# Patient Record
Sex: Male | Born: 1960 | Race: White | Hispanic: No | Marital: Married | Smoking: Never smoker
Health system: Southern US, Community
[De-identification: ages and names within clinical notes are randomized; demographics above are authoritative.]

## PROBLEM LIST (undated history)

## (undated) DIAGNOSIS — K9041 Non-celiac gluten sensitivity: Secondary | ICD-10-CM

## (undated) DIAGNOSIS — E785 Hyperlipidemia, unspecified: Secondary | ICD-10-CM

## (undated) DIAGNOSIS — F32A Depression, unspecified: Secondary | ICD-10-CM

## (undated) DIAGNOSIS — K635 Polyp of colon: Secondary | ICD-10-CM

## (undated) DIAGNOSIS — F329 Major depressive disorder, single episode, unspecified: Secondary | ICD-10-CM

## (undated) DIAGNOSIS — E8881 Metabolic syndrome: Secondary | ICD-10-CM

## (undated) DIAGNOSIS — I10 Essential (primary) hypertension: Secondary | ICD-10-CM

## (undated) DIAGNOSIS — R1031 Right lower quadrant pain: Secondary | ICD-10-CM

## (undated) DIAGNOSIS — S8992XA Unspecified injury of left lower leg, initial encounter: Secondary | ICD-10-CM

## (undated) DIAGNOSIS — B351 Tinea unguium: Secondary | ICD-10-CM

## (undated) DIAGNOSIS — E291 Testicular hypofunction: Secondary | ICD-10-CM

## (undated) DIAGNOSIS — M7121 Synovial cyst of popliteal space [Baker], right knee: Secondary | ICD-10-CM

## (undated) DIAGNOSIS — G43909 Migraine, unspecified, not intractable, without status migrainosus: Secondary | ICD-10-CM

## (undated) DIAGNOSIS — M542 Cervicalgia: Secondary | ICD-10-CM

## (undated) DIAGNOSIS — M1990 Unspecified osteoarthritis, unspecified site: Secondary | ICD-10-CM

## (undated) DIAGNOSIS — F419 Anxiety disorder, unspecified: Secondary | ICD-10-CM

## (undated) HISTORY — DX: Tinea unguium: B35.1

## (undated) HISTORY — DX: Non-celiac gluten sensitivity: K90.41

## (undated) HISTORY — DX: Unspecified osteoarthritis, unspecified site: M19.90

## (undated) HISTORY — DX: Anxiety disorder, unspecified: F41.9

## (undated) HISTORY — DX: Right lower quadrant pain: R10.31

## (undated) HISTORY — DX: Essential (primary) hypertension: I10

## (undated) HISTORY — DX: Synovial cyst of popliteal space (Baker), right knee: M71.21

## (undated) HISTORY — DX: Cervicalgia: M54.2

## (undated) HISTORY — DX: Metabolic syndrome: E88.81

## (undated) HISTORY — DX: Migraine, unspecified, not intractable, without status migrainosus: G43.909

## (undated) HISTORY — PX: OTHER SURGICAL HISTORY: SHX169

## (undated) HISTORY — DX: Testicular hypofunction: E29.1

## (undated) HISTORY — DX: Depression, unspecified: F32.A

## (undated) HISTORY — DX: Unspecified injury of left lower leg, initial encounter: S89.92XA

## (undated) HISTORY — DX: Polyp of colon: K63.5

## (undated) HISTORY — DX: Hyperlipidemia, unspecified: E78.5

## (undated) HISTORY — DX: Major depressive disorder, single episode, unspecified: F32.9

---

## 2000-07-28 ENCOUNTER — Ambulatory Visit (HOSPITAL_COMMUNITY): Admission: RE | Admit: 2000-07-28 | Discharge: 2000-07-28 | Payer: Self-pay | Admitting: Internal Medicine

## 2005-12-24 ENCOUNTER — Encounter: Admission: RE | Admit: 2005-12-24 | Discharge: 2005-12-24 | Payer: Self-pay | Admitting: Internal Medicine

## 2007-10-03 IMAGING — CR DG CERVICAL SPINE WITH FLEX & EXTEND
7 series · 7 of 7 positions shown · non-contrast
Comparison: none

CLINICAL DATA: Neck pain.  Degenerative disease.
CERVICAL SPINE, SEVEN VIEWS:

[w c-spine lat]
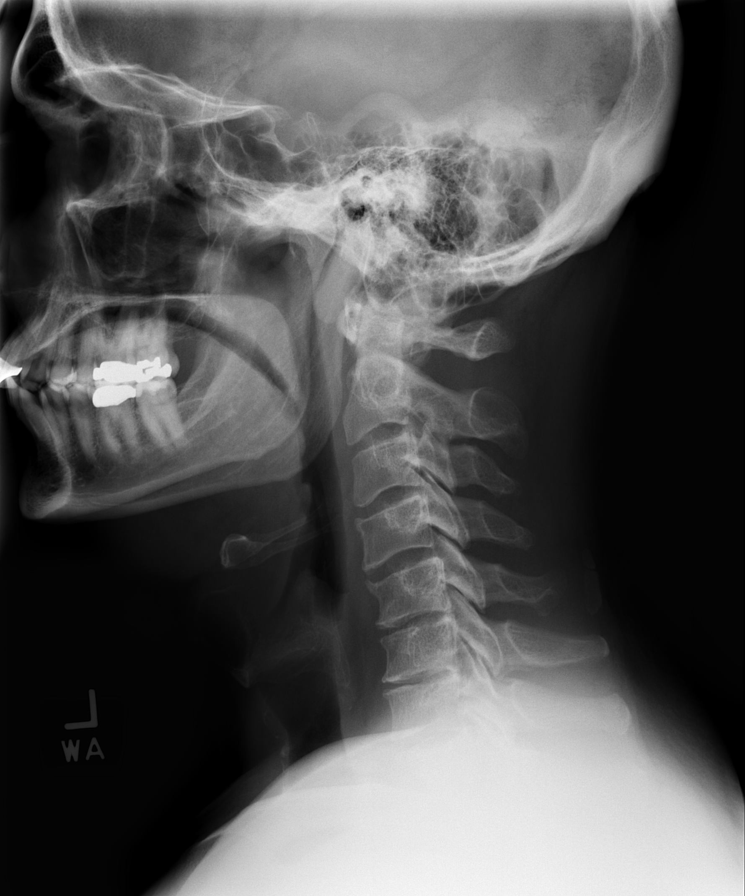

[w c-spine flexion]
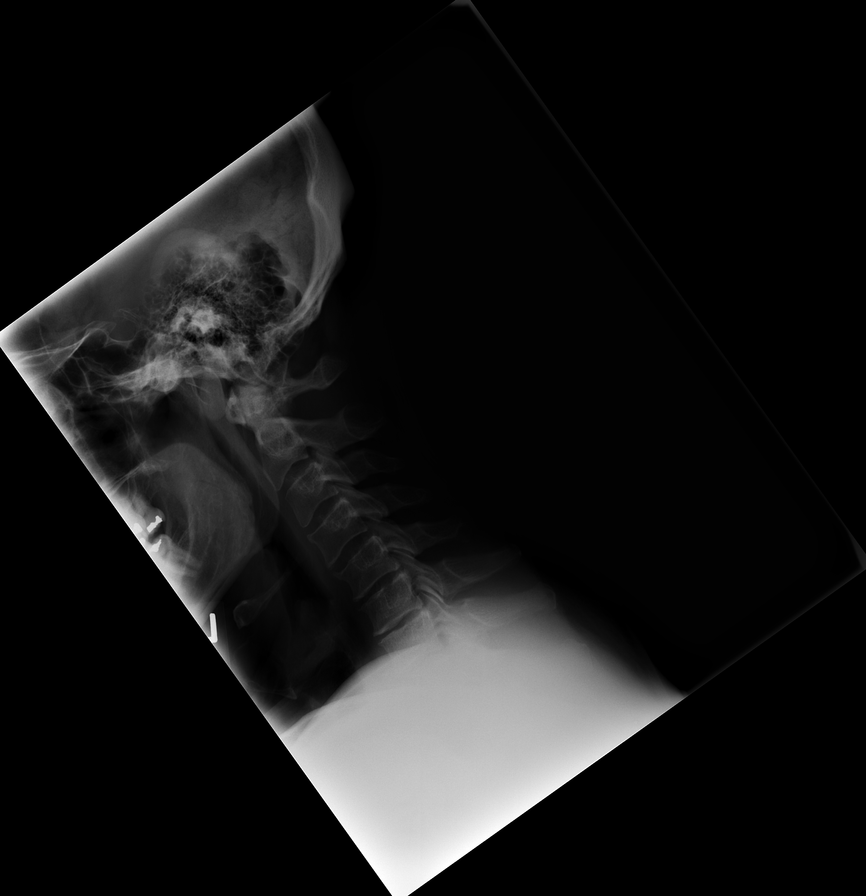

[w c-spine extension]
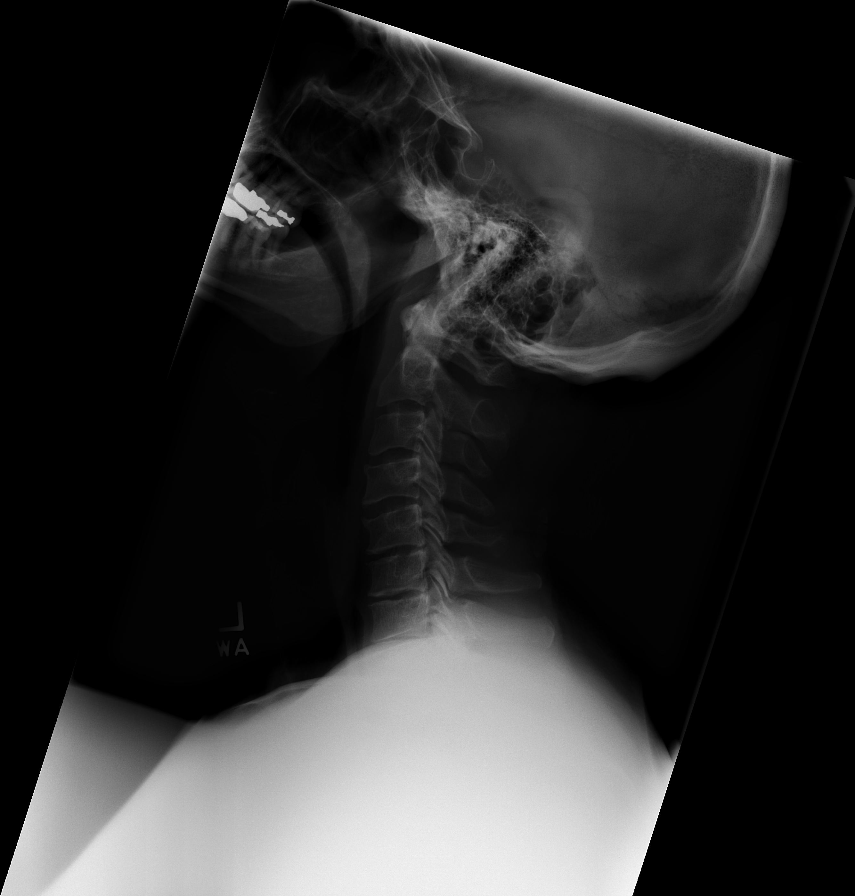

[w c-spine oblique (1 of 2)]
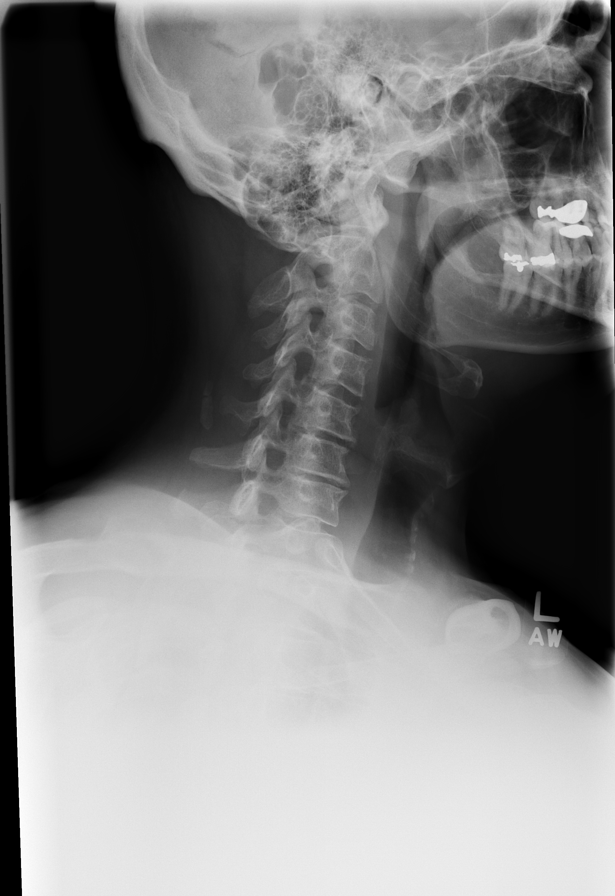

[w c-spine oblique (2 of 2)]
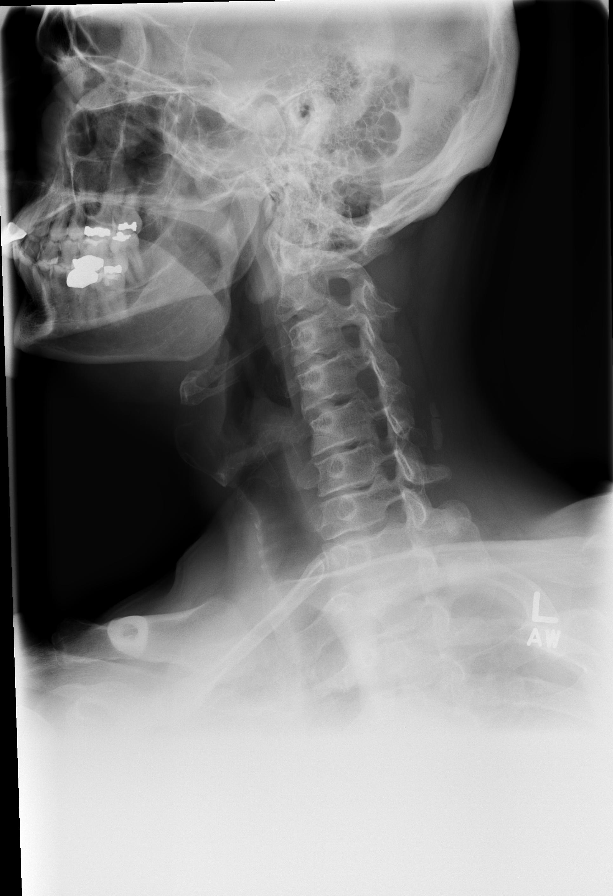

[w c-spine a.p. *]
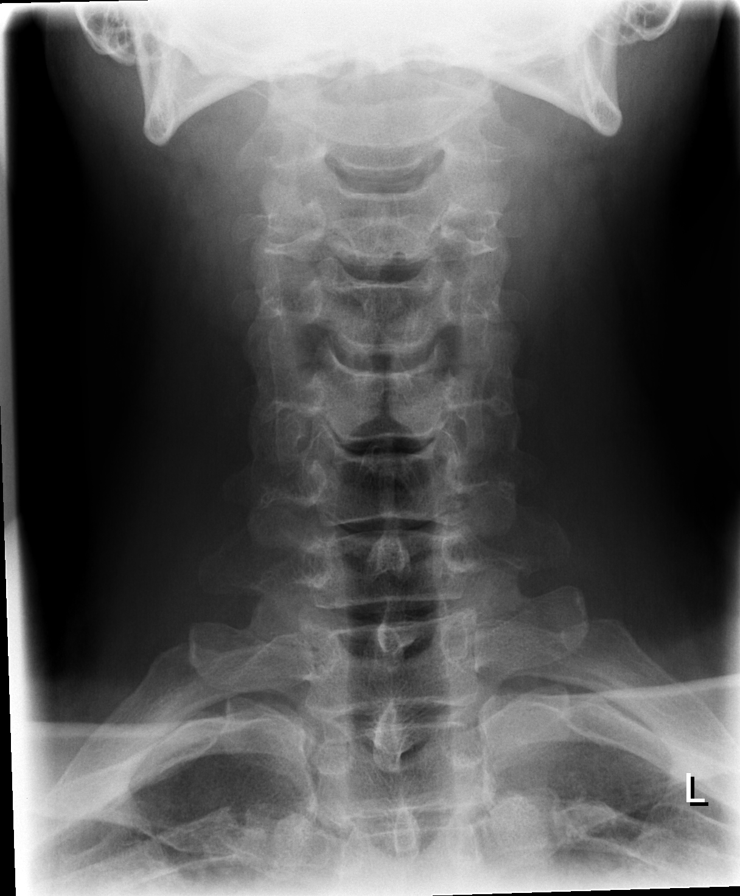

[w c-spine odontoid *]
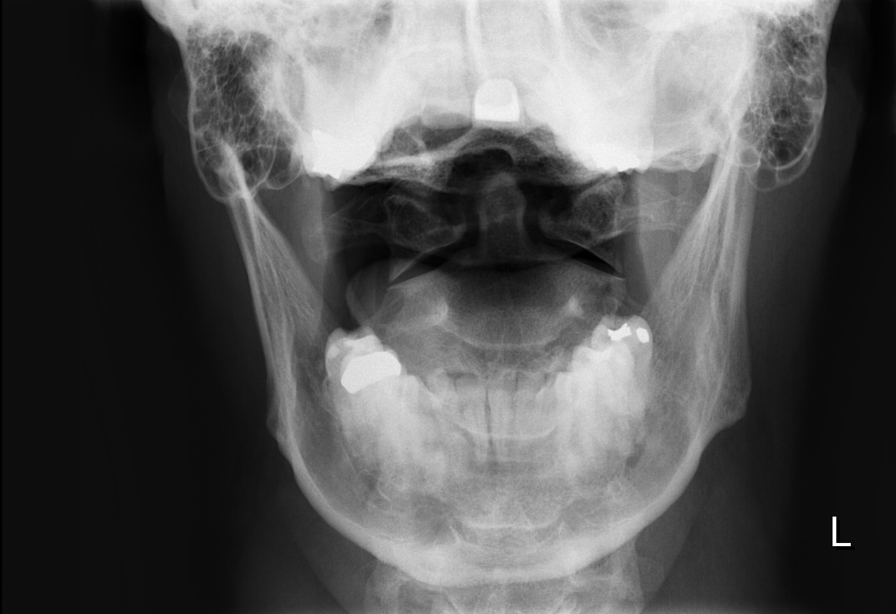

[7 of 7 positions shown; findings below may reference images not displayed]

FINDINGS: There is marked loss of disk space height at C5-6 and C6-7.  Vertebral body height and alignment are maintained.  No pathologic motion on flexion or extension.  There is some mild foraminal narrowing at C2-3 and C4-5 with left foraminal narrowing identified at C5-6.  Prevertebral soft tissues are unremarkable.
IMPRESSION: Degenerative disk disease, C5-6 and C6-7.  No acute findings.

## 2016-03-30 ENCOUNTER — Ambulatory Visit (INDEPENDENT_AMBULATORY_CARE_PROVIDER_SITE_OTHER): Payer: 59 | Admitting: Neurology

## 2016-03-30 ENCOUNTER — Encounter: Payer: Self-pay | Admitting: Neurology

## 2016-03-30 VITALS — BP 152/86 | HR 64 | Resp 20 | Ht 71.0 in | Wt 257.0 lb

## 2016-03-30 DIAGNOSIS — R0683 Snoring: Secondary | ICD-10-CM | POA: Diagnosis not present

## 2016-03-30 DIAGNOSIS — R61 Generalized hyperhidrosis: Secondary | ICD-10-CM | POA: Diagnosis not present

## 2016-03-30 DIAGNOSIS — D751 Secondary polycythemia: Secondary | ICD-10-CM

## 2016-03-30 DIAGNOSIS — F5103 Paradoxical insomnia: Secondary | ICD-10-CM | POA: Insufficient documentation

## 2016-03-30 DIAGNOSIS — R002 Palpitations: Secondary | ICD-10-CM | POA: Diagnosis not present

## 2016-03-30 DIAGNOSIS — R0689 Other abnormalities of breathing: Secondary | ICD-10-CM
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
## 2016-03-30 NOTE — Patient Instructions (Signed)

## 2016-04-15 ENCOUNTER — Telehealth: Payer: Self-pay | Admitting: Neurology

## 2016-04-15 DIAGNOSIS — R0683 Snoring: Secondary | ICD-10-CM

## 2016-04-15 DIAGNOSIS — G4730 Sleep apnea, unspecified: Principal | ICD-10-CM

## 2016-04-15 DIAGNOSIS — G4710 Hypersomnia, unspecified: Secondary | ICD-10-CM

## 2016-04-15 NOTE — Telephone Encounter (Signed)
[employer] denied Split and suggest HST.  Could I get an order for a HST?

## 2016-04-15 NOTE — Telephone Encounter (Signed)
insurance insists on HST. Patient in antidepressants,  Banzodiazepam, has asthma, anxiety, lipids and HTN.

## 2016-04-21 ENCOUNTER — Telehealth: Payer: Self-pay | Admitting: Neurology

## 2016-04-21 ENCOUNTER — Encounter: Payer: Self-pay | Admitting: Neurology

## 2016-04-21 DIAGNOSIS — G4733 Obstructive sleep apnea (adult) (pediatric): Secondary | ICD-10-CM

## 2016-04-21 NOTE — Telephone Encounter (Signed)
Sleep study

## 2016-04-21 NOTE — Telephone Encounter (Signed)
Patient [facility] have an [facility].  In lab for the patient is covered at 100% but HST is not covered at 100%.  Could you give me an order for an in-lab study?

## 2016-05-06 ENCOUNTER — Ambulatory Visit (INDEPENDENT_AMBULATORY_CARE_PROVIDER_SITE_OTHER): Payer: 59 | Admitting: Neurology

## 2016-05-06 DIAGNOSIS — G4733 Obstructive sleep apnea (adult) (pediatric): Secondary | ICD-10-CM

## 2016-05-08 NOTE — Procedures (Signed)
PATIENT'S NAME:  Raymond Odonnell, Raymond Odonnell DOB:      09-11-1960      MR#:    [id number]     DATE OF RECORDING: 05/06/2016 REFERRING M.D.:  Rodrigo RanMark Perini, MD Study Performed:   Baseline Polysomnogram HISTORY:  Raymond Odonnell is a 55 y.o. male, seen here as a referral from Dr. Waynard EdwardsPerini for sleep evaluation, for witnessed snoring. Raymond Odonnell has been diagnosed with asthma, HTN, Nocturia, snoring, hyperlipidemia and high HGL/HCT, Depression, Insomnia, Morbid obesity, Joint pain.  The patient endorsed the Epworth Sleepiness Scale at 3/24 points.  The patient's weight 258 pounds with a height of 71 (inches), resulting in a BMI of 36.1 kg/m2.The patient's neck circumference measured 18 inches.  CURRENT MEDICATIONS: Asmanex, Anaprox, B 12, Klonopin, Vit D, Wellbutrin and Ziac .     PROCEDURE:  This is a multichannel digital polysomnogram utilizing the Somnostar 11.2 system.  Electrodes and sensors were applied and monitored per AASM Specifications.   EEG, EOG, Chin and Limb EMG, were sampled at 200 Hz.  ECG, Snore and Nasal Pressure, Thermal Airflow, Respiratory Effort, CPAP Flow and Pressure, Oximetry was sampled at 50 Hz. Digital video and audio were recorded.      BASELINE STUDY  Lights Out was at 20:59 and Lights On at 05:15.  Total recording time (TRT) was 496.5 minutes, with a total sleep time (TST) of 414.5 minutes.   The patient's sleep latency was 23 minutes.  REM latency was 95.5 minutes.  The sleep efficiency was 83.5 %.     SLEEP ARCHITECTURE: WASO (Wake after sleep onset) was 59 minutes.  There were 11.5 minutes in Stage N1, 259.5 minutes Stage N2, 79.5 minutes Stage N3 and 64 minutes in Stage REM.  The percentage of Stage N1 was 2.8%, Stage N2 was 62.6%, Stage N3 was 19.2% and Stage R (REM sleep) was 15.4%.   RESPIRATORY ANALYSIS:  There were a total of 16 respiratory events:  3 obstructive apneas, 0 central apneas and 0 mixed apneas with a total of 3 apneas and an apnea index (AI) of 0.4 /hour.  There were 13 hypopneas with a hypopnea index of 1.9 /hour. The patient also had 0 respiratory event related arousals (RERAs).    The total APNEA/HYPOPNEA INDEX (AHI) was 2.3/hour and the total RESPIRATORY DISTURBANCE INDEX was 2.3 /hour.  1 event occurred in REM sleep and 26 events in NREM. The REM AHI was .9 /hour, versus a non-REM AHI of 2.6. The patient spent 51 minutes of total sleep time in the supine position and 364 minutes in non-supine. The supine AHI was 17.7 versus a non-supine AHI of 0.2. OXYGEN SATURATION:  The Wake baseline 02 saturation was 94%, with the lowest being 90%.   PERIODIC LIMB MOVEMENTS:   The patient had a total of 0 Periodic Limb Movements.   The arousals were noted as: 46 were spontaneous, 0 were associated with PLMs, 10 were associated with respiratory events. Audio and video analysis did not show any abnormal or unusual movements, behaviors, phonations or vocalizations.     The patient took one bathroom break.  Loud snoring was noted.  EKG was in keeping with normal sinus rhythm (NSR).  IMPRESSION:  1. Evidence of clinically insignificant sleep apnea, only noted when in supine.  2.  Loud snoring. Not leading to arousals.   RECOMMENDATIONS: 3. Apnea was very much dependent on supine sleep position, avoiding supine sleep and losing weight are the main recommendations. Snoring can be treated with dental devices  and ENT procedures, too.  A follow up appointment [facility] be scheduled in [facility]. The referring provider will be notified of the results.      I certify that I have reviewed the entire raw data recording prior to the issuance of this report in accordance with the Standards of Accreditation of the American Academy of Sleep Medicine (AASM)    Melvyn Novasarmen [place], MD     05-08-2016  Diplomat, American Board of Psychiatry and Neurology  Diplomat, American Board of Sleep Medicine Medical Director, [facility]  [employer]

## 2016-05-14 ENCOUNTER — Telehealth: Payer: Self-pay

## 2016-05-14 NOTE — Telephone Encounter (Signed)
I called pt to discuss sleep study results. No answer, left a message asking him to call me back. If pt calls me back on Friday, please advise him that I will return his call on Monday.

## 2016-05-14 NOTE — Telephone Encounter (Signed)
-----   Message from Melvyn Novasarmen Dohmeier, MD sent at 05/08/2016  1:16 PM EST ----- 1. Evidence of clinically insignificant , mild sleep apnea, only notable in supine sleep.  2. Loud snoring. Not leading to arousals.   RECOMMENDATIONS: 3. Apnea was very much dependent on supine sleep position, avoiding supine sleep and losing weight are the main recommendations. Snoring can be treated with dental devices and ENT procedures, too.  A follow up appointment [facility] be scheduled [facility]. The referring provider will be notified of the results.

## 2016-05-19 NOTE — Telephone Encounter (Signed)
-----   Message from Carmen Dohmeier, MD sent at 05/08/2016  1:16 PM EST ----- 1. Evidence of clinically insignificant , mild sleep apnea, only notable in supine sleep.  2. Loud snoring. Not leading to arousals.   RECOMMENDATIONS: 3. Apnea was very much dependent on supine sleep position, avoiding supine sleep and losing weight are the main recommendations. Snoring can be treated with dental devices and ENT procedures, too.  A follow up appointment can be scheduled in the Sleep Clinic at [facility]. The referring provider will be notified of the results.      

## 2016-05-19 NOTE — Telephone Encounter (Signed)
I spoke to patient and his wife. They are aware of results and would like to schedule a f/u with Dr. Vickey Hugerohmeier. They love out of town and ask if they can come in the afternoon of 1/18. They will be coming into town that day. I stated that we will keep an eye out for openings.

## 2016-05-25 NOTE — Telephone Encounter (Signed)
I called pt to offer him an appt on 05/28/16 at 8:00am. I spoke to pt's wife, and she declined this appt, she does [place] think [facility] [place] [facility].  Pt's wife is asking if Dr. Vickey Hugerohmeier can call them to discuss what to do/ treatment based on pt's sleep study results. [place] and it is very difficult to make it up here for an appt.  If Dr. Vickey Hugerohmeier does not want to call pt, pt's wife is asking for an appt on 06/01/2016 if someone cancels. (Right now, Dr.Dohmeier's schedule is full that day.)

## 2016-05-25 NOTE — Telephone Encounter (Signed)
Do we have a 10 AM apointment that day?

## 2016-05-25 NOTE — Telephone Encounter (Signed)
No, everything as of right now is booked on 06/01/2016. We might have cancellations sooner but there is no guarantee of that.

## 2016-05-25 NOTE — Telephone Encounter (Signed)
Spoke to wife, no need to see me. No, I didn't find an explanation for high Hgb and Hct. Nor  prolonged hypoxemia, apnea or Co2 retention.  Patient snores but has not seen a need to treat just snoring. He will use a tennis ball to avoid supine sleep.  He will call Dr Waynard EdwardsPerini for further work up. He can call me should he consider a dental device, [facility] has several offices in his area of residence.

## 2018-06-07 ENCOUNTER — Other Ambulatory Visit: Payer: Self-pay | Admitting: Internal Medicine

## 2018-06-07 DIAGNOSIS — E785 Hyperlipidemia, unspecified: Secondary | ICD-10-CM

## 2018-06-30 ENCOUNTER — Other Ambulatory Visit: Payer: Self-pay

## 2018-07-12 ENCOUNTER — Other Ambulatory Visit: Payer: Self-pay

## 2018-08-10 ENCOUNTER — Ambulatory Visit: Payer: 59 | Admitting: Neurology

## 2018-08-10 ENCOUNTER — Other Ambulatory Visit: Payer: Self-pay

## 2018-09-29 ENCOUNTER — Other Ambulatory Visit: Payer: Self-pay

## 2018-10-18 ENCOUNTER — Other Ambulatory Visit: Payer: Self-pay

## 2019-07-19 ENCOUNTER — Other Ambulatory Visit: Payer: Self-pay | Admitting: Internal Medicine

## 2019-07-19 DIAGNOSIS — E785 Hyperlipidemia, unspecified: Secondary | ICD-10-CM

## 2019-11-08 ENCOUNTER — Inpatient Hospital Stay: Admission: RE | Admit: 2019-11-08 | Payer: Self-pay | Source: Ambulatory Visit

## 2019-12-07 ENCOUNTER — Other Ambulatory Visit: Payer: Self-pay

## 2020-11-28 ENCOUNTER — Other Ambulatory Visit: Payer: Self-pay | Admitting: Family Medicine

## 2020-11-28 DIAGNOSIS — J189 Pneumonia, unspecified organism: Secondary | ICD-10-CM

## 2024-08-31 ENCOUNTER — Institutional Professional Consult (permissible substitution): Admitting: Neurology
